# Patient Record
Sex: Female | Born: 1990
Health system: Southern US, Community
[De-identification: ages and names within clinical notes are randomized; demographics above are authoritative.]

---

## 2012-01-26 ENCOUNTER — Encounter (HOSPITAL_BASED_OUTPATIENT_CLINIC_OR_DEPARTMENT_OTHER): Payer: Self-pay | Admitting: *Deleted

## 2012-01-26 ENCOUNTER — Emergency Department (HOSPITAL_BASED_OUTPATIENT_CLINIC_OR_DEPARTMENT_OTHER)
Admission: EM | Admit: 2012-01-26 | Discharge: 2012-01-26 | Disposition: A | Discharge status: Home / Self Care | Payer: No Typology Code available for payment source | Attending: Emergency Medicine | Admitting: Emergency Medicine

## 2012-01-26 ENCOUNTER — Emergency Department (HOSPITAL_BASED_OUTPATIENT_CLINIC_OR_DEPARTMENT_OTHER): Payer: No Typology Code available for payment source

## 2012-01-26 DIAGNOSIS — R079 Chest pain, unspecified: Secondary | ICD-10-CM | POA: Insufficient documentation

## 2012-01-26 DIAGNOSIS — M25519 Pain in unspecified shoulder: Secondary | ICD-10-CM | POA: Insufficient documentation

## 2012-01-26 DIAGNOSIS — M542 Cervicalgia: Secondary | ICD-10-CM | POA: Insufficient documentation

## 2012-01-26 MED ORDER — HYDROCODONE-ACETAMINOPHEN 5-325 MG PO TABS: 2.0000 | ORAL_TABLET | ORAL | Status: AC | PRN
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
## 2012-01-26 NOTE — ED Provider Notes (Signed)
Medical screening examination/treatment/procedure(s) were performed by non-physician practitioner and as supervising physician I was immediately available for consultation/collaboration.   Zaeem Kandel, MD 01/26/12 2224 
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
## 2012-02-13 ENCOUNTER — Emergency Department (HOSPITAL_BASED_OUTPATIENT_CLINIC_OR_DEPARTMENT_OTHER)
Admission: EM | Admit: 2012-02-13 | Discharge: 2012-02-13 | Disposition: A | Discharge status: Home / Self Care | Payer: No Typology Code available for payment source | Attending: Emergency Medicine | Admitting: Emergency Medicine

## 2012-02-13 ENCOUNTER — Encounter (HOSPITAL_BASED_OUTPATIENT_CLINIC_OR_DEPARTMENT_OTHER): Payer: Self-pay | Admitting: *Deleted

## 2012-02-13 DIAGNOSIS — B86 Scabies: Secondary | ICD-10-CM | POA: Insufficient documentation

## 2012-02-13 MED ORDER — HYDROXYZINE HCL 25 MG PO TABS: 25.0000 mg | ORAL_TABLET | Freq: Four times a day (QID) | ORAL | Status: AC

## 2012-02-13 MED ORDER — PERMETHRIN 5 % EX CREA: TOPICAL_CREAM | CUTANEOUS | Status: AC

## 2012-02-13 NOTE — ED Provider Notes (Signed)
History     CSN: [id number]  Arrival date & time 02/13/12  1706   First MD Initiated Contact with Patient 02/13/12 1748      Chief Complaint  Patient presents with  . Rash    (Consider location/radiation/quality/duration/timing/severity/associated sxs/prior treatment) Patient is a 21 y.o. female presenting with rash. The history is provided by the patient. No language interpreter was used.  Rash  This is a new problem. The problem has been gradually worsening. The problem is associated with nothing. There has been no fever. The rash is present on the right hand and left hand. The pain is at a severity of 3/10. The pain is moderate. Associated symptoms include itching. She has tried nothing for the symptoms. The treatment provided no relief.  Pt complains of itching to both hands.  Pt complains of a rash.  History reviewed. No pertinent past medical history.  History reviewed. No pertinent past surgical history.  History reviewed. No pertinent family history.  History  Substance Use Topics  . Smoking status: Never Smoker   . Smokeless tobacco: Not on file  . Alcohol Use: No    OB History    Grav Para Term Preterm Abortions TAB SAB Ect Mult Living                  Review of Systems  Skin: Positive for itching and rash.  All other systems reviewed and are negative.    Allergies  Review of patient's allergies indicates no known allergies.  Home Medications  No current outpatient prescriptions on file.  BP 115/75  Pulse 86  Temp 98.6 F (37 C) (Oral)  Resp 16  Ht 5' (1.524 m)  Wt 129 lb (58.514 kg)  BMI 25.19 kg/m2  SpO2 100%  LMP 02/13/2012  Physical Exam  Nursing note and vitals reviewed. Constitutional: She is oriented to person, place, and time. She appears well-developed and well-nourished.  HENT:  Head: Normocephalic and atraumatic.  Cardiovascular: Normal rate.   Pulmonary/Chest: Effort normal.  Musculoskeletal: Normal range of motion.    Neurological: She is alert and oriented to person, place, and time. She has normal reflexes.  Skin: Rash noted.       Burrows between fingers,    Psychiatric: She has a normal mood and affect.    ED Course  Procedures (including critical care time)  Labs Reviewed - No data to display No results found.   1. Scabies       MDM  Atarax and elemite.        Lonia Skinner [place], [place] 02/13/12 1810  Lonia Skinner [place], [place] 02/13/12 [phone]

## 2012-02-13 NOTE — ED Provider Notes (Signed)
Medical screening examination/treatment/procedure(s) were performed by non-physician practitioner and as supervising physician I was immediately available for consultation/collaboration.   Madesyn Ast, MD 02/13/12 2337 

## 2012-02-13 NOTE — ED Notes (Signed)
Pt c/o rash to bil hands x 1 week
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
## 2014-01-08 ENCOUNTER — Inpatient Hospital Stay (HOSPITAL_COMMUNITY)
Admission: AD | Admit: 2014-01-08 | Discharge: 2014-01-08 | Disposition: A | Discharge status: Home / Self Care | Payer: No Typology Code available for payment source | Attending: Obstetrics and Gynecology | Admitting: Obstetrics and Gynecology

## 2015-02-01 ENCOUNTER — Encounter (HOSPITAL_COMMUNITY): Payer: Self-pay | Admitting: Emergency Medicine

## 2015-02-01 ENCOUNTER — Emergency Department (HOSPITAL_COMMUNITY)
Admission: EM | Admit: 2015-02-01 | Discharge: 2015-02-01 | Disposition: A | Discharge status: Home / Self Care | Payer: No Typology Code available for payment source | Attending: Emergency Medicine | Admitting: Emergency Medicine

## 2015-02-01 DIAGNOSIS — Y9389 Activity, other specified: Secondary | ICD-10-CM | POA: Insufficient documentation

## 2015-02-01 DIAGNOSIS — Y9289 Other specified places as the place of occurrence of the external cause: Secondary | ICD-10-CM | POA: Insufficient documentation

## 2015-02-01 DIAGNOSIS — S3992XA Unspecified injury of lower back, initial encounter: Secondary | ICD-10-CM | POA: Insufficient documentation

## 2015-02-01 DIAGNOSIS — Y998 Other external cause status: Secondary | ICD-10-CM | POA: Insufficient documentation

## 2015-02-01 DIAGNOSIS — S161XXA Strain of muscle, fascia and tendon at neck level, initial encounter: Secondary | ICD-10-CM

## 2015-02-01 MED ORDER — IBUPROFEN 600 MG PO TABS: 600.0000 mg | ORAL_TABLET | Freq: Four times a day (QID) | ORAL | Status: AC | PRN

## 2015-02-01 MED ORDER — CYCLOBENZAPRINE HCL 10 MG PO TABS
10.0000 mg | ORAL_TABLET | Freq: Two times a day (BID) | ORAL | Status: AC | PRN
Start: 2015-02-01 — End: ?
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
## 2015-02-01 NOTE — Discharge Instructions (Signed)
Cervical Strain and Sprain (Whiplash) °with Rehab °Cervical strain and sprain are injuries that commonly occur with "whiplash" injuries. Whiplash occurs when the neck is forcefully whipped backward or forward, such as during a motor vehicle accident or during contact sports. The muscles, ligaments, tendons, discs, and nerves of the neck are susceptible to injury when this occurs. °RISK FACTORS °Risk of having a whiplash injury increases if: °· Osteoarthritis of the spine. °· Situations that make head or neck accidents or trauma more likely. °· High-risk sports (football, rugby, wrestling, hockey, auto racing, gymnastics, diving, contact karate, or boxing). °· Poor strength and flexibility of the neck. °· Previous neck injury. °· Poor tackling technique. °· Improperly fitted or padded equipment. °SYMPTOMS  °· Pain or stiffness in the front or back of neck or both. °· Symptoms may present immediately or up to 24 hours after injury. °· Dizziness, headache, nausea, and vomiting. °· Muscle spasm with soreness and stiffness in the neck. °· Tenderness and swelling at the injury site. °PREVENTION °· Learn and use proper technique (avoid tackling with the head, spearing, and head-butting; use proper falling techniques to avoid landing on the head). °· Warm up and stretch properly before activity. °· Maintain physical fitness: °· Strength, flexibility, and endurance. °· Cardiovascular fitness. °· Wear properly fitted and padded protective equipment, such as padded soft collars, for participation in contact sports. °PROGNOSIS  °Recovery from cervical strain and sprain injuries is dependent on the extent of the injury. These injuries are usually curable in 1 week to 3 months with appropriate treatment.  °RELATED COMPLICATIONS  °· Temporary numbness and weakness may occur if the nerve roots are damaged, and this may persist until the nerve has completely healed. °· Chronic pain due to frequent recurrence of  symptoms. °· Prolonged healing, especially if activity is resumed too soon (before complete recovery). °TREATMENT  °Treatment initially involves the use of ice and medication to help reduce pain and inflammation. It is also important to perform strengthening and stretching exercises and modify activities that worsen symptoms so the injury does not get worse. These exercises may be performed at home or with a therapist. For patients who experience severe symptoms, a soft, padded collar may be recommended to be worn around the neck.  °Improving your posture may help reduce symptoms. Posture improvement includes pulling your chin and abdomen in while sitting or standing. If you are sitting, sit in a firm chair with your buttocks against the back of the chair. While sleeping, try replacing your pillow with a small towel rolled to 2 inches in diameter, or use a cervical pillow or soft cervical collar. Poor sleeping positions delay healing.  °For patients with nerve root damage, which causes numbness or weakness, the use of a cervical traction apparatus may be recommended. Surgery is rarely necessary for these injuries. However, cervical strain and sprains that are present at birth (congenital) may require surgery. °MEDICATION  °· If pain medication is necessary, nonsteroidal anti-inflammatory medications, such as aspirin and ibuprofen, or other minor pain relievers, such as acetaminophen, are often recommended. °· Do not take pain medication for 7 days before surgery. °· Prescription pain relievers may be given if deemed necessary by your caregiver. Use only as directed and only as much as you need. °HEAT AND COLD:  °· Cold treatment (icing) relieves pain and reduces inflammation. Cold treatment should be applied for 10 to 15 minutes every 2 to 3 hours for inflammation and pain and immediately after any activity that aggravates   your symptoms. Use ice packs or an ice massage. °· Heat treatment may be used prior to  performing the stretching and strengthening activities prescribed by your caregiver, physical therapist, or athletic trainer. Use a heat pack or a warm soak. °SEEK MEDICAL CARE IF:  °· Symptoms get worse or do not improve in 2 weeks despite treatment. °· New, unexplained symptoms develop (drugs used in treatment may produce side effects). °EXERCISES °RANGE OF MOTION (ROM) AND STRETCHING EXERCISES - Cervical Strain and Sprain °These exercises may help you when beginning to rehabilitate your injury. In order to successfully resolve your symptoms, you must improve your posture. These exercises are designed to help reduce the forward-head and rounded-shoulder posture which contributes to this condition. Your symptoms may resolve with or without further involvement from your physician, physical therapist or athletic trainer. While completing these exercises, remember:  °· Restoring tissue flexibility helps normal motion to return to the joints. This allows healthier, less painful movement and activity. °· An effective stretch should be held for at least 20 seconds, although you may need to begin with shorter hold times for comfort. °· A stretch should never be painful. You should only feel a gentle lengthening or release in the stretched tissue. °STRETCH- Axial Extensors °· Lie on your back on the floor. You may bend your knees for comfort. Place a rolled-up hand towel or dish towel, about 2 inches in diameter, under the part of your head that makes contact with the floor. °· Gently tuck your chin, as if trying to make a "double chin," until you feel a gentle stretch at the base of your head. °· Hold __________ seconds. °Repeat __________ times. Complete this exercise __________ times per day.  °STRETCH - Axial Extension  °· Stand or sit on a firm surface. Assume a good posture: chest up, shoulders drawn back, abdominal muscles slightly tense, knees unlocked (if standing) and feet hip width apart. °· Slowly retract your  chin so your head slides back and your chin slightly lowers. Continue to look straight ahead. °· You should feel a gentle stretch in the back of your head. Be certain not to feel an aggressive stretch since this can cause headaches later. °· Hold for __________ seconds. °Repeat __________ times. Complete this exercise __________ times per day. °STRETCH - Cervical Side Bend  °· Stand or sit on a firm surface. Assume a good posture: chest up, shoulders drawn back, abdominal muscles slightly tense, knees unlocked (if standing) and feet hip width apart. °· Without letting your nose or shoulders move, slowly tip your right / left ear to your shoulder until your feel a gentle stretch in the muscles on the opposite side of your neck. °· Hold __________ seconds. °Repeat __________ times. Complete this exercise __________ times per day. °STRETCH - Cervical Rotators  °· Stand or sit on a firm surface. Assume a good posture: chest up, shoulders drawn back, abdominal muscles slightly tense, knees unlocked (if standing) and feet hip width apart. °· Keeping your eyes level with the ground, slowly turn your head until you feel a gentle stretch along the back and opposite side of your neck. °· Hold __________ seconds. °Repeat __________ times. Complete this exercise __________ times per day. °RANGE OF MOTION - Neck Circles  °· Stand or sit on a firm surface. Assume a good posture: chest up, shoulders drawn back, abdominal muscles slightly tense, knees unlocked (if standing) and feet hip width apart. °· Gently roll your head down and around from the   back of one shoulder to the back of the other. The motion should never be forced or painful. °· Repeat the motion 10-20 times, or until you feel the neck muscles relax and loosen. °Repeat __________ times. Complete the exercise __________ times per day. °STRENGTHENING EXERCISES - Cervical Strain and Sprain °These exercises may help you when beginning to rehabilitate your injury. They may  resolve your symptoms with or without further involvement from your physician, physical therapist, or athletic trainer. While completing these exercises, remember:  °· Muscles can gain both the endurance and the strength needed for everyday activities through controlled exercises. °· Complete these exercises as instructed by your physician, physical therapist, or athletic trainer. Progress the resistance and repetitions only as guided. °· You may experience muscle soreness or fatigue, but the pain or discomfort you are trying to eliminate should never worsen during these exercises. If this pain does worsen, stop and make certain you are following the directions exactly. If the pain is still present after adjustments, discontinue the exercise until you can discuss the trouble with your clinician. °STRENGTH - Cervical Flexors, Isometric °· Face a wall, standing about 6 inches away. Place a small pillow, a ball about 6-8 inches in diameter, or a folded towel between your forehead and the wall. °· Slightly tuck your chin and gently push your forehead into the soft object. Push only with mild to moderate intensity, building up tension gradually. Keep your jaw and forehead relaxed. °· Hold 10 to 20 seconds. Keep your breathing relaxed. °· Release the tension slowly. Relax your neck muscles completely before you start the next repetition. °Repeat __________ times. Complete this exercise __________ times per day. °STRENGTH- Cervical Lateral Flexors, Isometric  °· Stand about 6 inches away from a wall. Place a small pillow, a ball about 6-8 inches in diameter, or a folded towel between the side of your head and the wall. °· Slightly tuck your chin and gently tilt your head into the soft object. Push only with mild to moderate intensity, building up tension gradually. Keep your jaw and forehead relaxed. °· Hold 10 to 20 seconds. Keep your breathing relaxed. °· Release the tension slowly. Relax your neck muscles completely  before you start the next repetition. °Repeat __________ times. Complete this exercise __________ times per day. °STRENGTH - Cervical Extensors, Isometric  °· Stand about 6 inches away from a wall. Place a small pillow, a ball about 6-8 inches in diameter, or a folded towel between the back of your head and the wall. °· Slightly tuck your chin and gently tilt your head back into the soft object. Push only with mild to moderate intensity, building up tension gradually. Keep your jaw and forehead relaxed. °· Hold 10 to 20 seconds. Keep your breathing relaxed. °· Release the tension slowly. Relax your neck muscles completely before you start the next repetition. °Repeat __________ times. Complete this exercise __________ times per day. °POSTURE AND BODY MECHANICS CONSIDERATIONS - Cervical Strain and Sprain °Keeping correct posture when sitting, standing or completing your activities will reduce the stress put on different body tissues, allowing injured tissues a chance to heal and limiting painful experiences. The following are general guidelines for improved posture. Your physician or physical therapist will provide you with any instructions specific to your needs. While reading these guidelines, remember: °· The exercises prescribed by your provider will help you have the flexibility and strength to maintain correct postures. °· The correct posture provides the optimal environment for your joints to   work. All of your joints have less wear and tear when properly supported by a spine with good posture. This means you will experience a healthier, less painful body. °· Correct posture must be practiced with all of your activities, especially prolonged sitting and standing. Correct posture is as important when doing repetitive low-stress activities (typing) as it is when doing a single heavy-load activity (lifting). °PROLONGED STANDING WHILE SLIGHTLY LEANING FORWARD °When completing a task that requires you to lean  forward while standing in one place for a long time, place either foot up on a stationary 2- to 4-inch high object to help maintain the best posture. When both feet are on the ground, the low back tends to lose its slight inward curve. If this curve flattens (or becomes too large), then the back and your other joints will experience too much stress, fatigue more quickly, and can cause pain.  °RESTING POSITIONS °Consider which positions are most painful for you when choosing a resting position. If you have pain with flexion-based activities (sitting, bending, stooping, squatting), choose a position that allows you to rest in a less flexed posture. You would want to avoid curling into a fetal position on your side. If your pain worsens with extension-based activities (prolonged standing, working overhead), avoid resting in an extended position such as sleeping on your stomach. Most people will find more comfort when they rest with their spine in a more neutral position, neither too rounded nor too arched. Lying on a non-sagging bed on your side with a pillow between your knees, or on your back with a pillow under your knees will often provide some relief. Keep in mind, being in any one position for a prolonged period of time, no matter how correct your posture, can still lead to stiffness. °WALKING °Walk with an upright posture. Your ears, shoulders, and hips should all line up. °OFFICE WORK °When working at a desk, create an environment that supports good, upright posture. Without extra support, muscles fatigue and lead to excessive strain on joints and other tissues. °CHAIR: °· A chair should be able to slide under your desk when your back makes contact with the back of the chair. This allows you to work closely. °· The chair's height should allow your eyes to be level with the upper part of your monitor and your hands to be slightly lower than your elbows. °· Body position: °¨ Your feet should make contact with the  floor. If this is not possible, use a foot rest. °¨ Keep your ears over your shoulders. This will reduce stress on your neck and low back. °Document Released: 05/23/2005 Document Revised: 10/07/2013 Document Reviewed: 09/04/2008 °[employer]® Patient Information ©2015 [employer]. This information is not intended to replace advice given to you by your health care provider. Make sure you discuss any questions you have with your health care provider. °Motor Vehicle Collision °It is common to have multiple bruises and sore muscles after a motor vehicle collision (MVC). These tend to feel worse for the first 24 hours. You may have the most stiffness and soreness over the first several hours. You may also feel worse when you wake up the first morning after your collision. After this point, you will usually begin to improve with each day. The speed of improvement often depends on the severity of the collision, the number of injuries, and the location and nature of these injuries. °HOME CARE INSTRUCTIONS °· Put ice on the injured area. °¨ Put ice in a   plastic bag. °¨ Place a towel between your skin and the bag. °¨ Leave the ice on for 15-20 minutes, 3-4 times a day, or as directed by your health care provider. °· Drink enough fluids to keep your urine clear or pale yellow. Do not drink alcohol. °· Take a warm shower or bath once or twice a day. This will increase blood flow to sore muscles. °· You may return to activities as directed by your caregiver. Be careful when lifting, as this may aggravate neck or back pain. °· Only take over-the-counter or prescription medicines for pain, discomfort, or fever as directed by your caregiver. Do not use aspirin. This may increase bruising and bleeding. °SEEK IMMEDIATE MEDICAL CARE IF: °· You have numbness, tingling, or weakness in the arms or legs. °· You develop severe headaches not relieved with medicine. °· You have severe neck pain, especially tenderness in the middle of the back  of your neck. °· You have changes in bowel or bladder control. °· There is increasing pain in any area of the body. °· You have shortness of breath, light-headedness, dizziness, or fainting. °· You have chest pain. °· You feel sick to your stomach (nauseous), throw up (vomit), or sweat. °· You have increasing abdominal discomfort. °· There is blood in your urine, stool, or vomit. °· You have pain in your shoulder (shoulder strap areas). °· You feel your symptoms are getting worse. °MAKE SURE YOU: °· Understand these instructions. °· Will watch your condition. °· Will get help right away if you are not doing well or get worse. °Document Released: 05/23/2005 Document Revised: 10/07/2013 Document Reviewed: 10/20/2010 °[employer]® Patient Information ©2015 [employer]. This information is not intended to replace advice given to you by your health care provider. Make sure you discuss any questions you have with your health care provider. ° °
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
## 2015-02-10 ENCOUNTER — Emergency Department (HOSPITAL_BASED_OUTPATIENT_CLINIC_OR_DEPARTMENT_OTHER)
Admission: EM | Admit: 2015-02-10 | Discharge: 2015-02-10 | Disposition: A | Discharge status: Home / Self Care | Payer: No Typology Code available for payment source | Attending: Emergency Medicine | Admitting: Emergency Medicine

## 2015-02-10 ENCOUNTER — Encounter (HOSPITAL_BASED_OUTPATIENT_CLINIC_OR_DEPARTMENT_OTHER): Payer: Self-pay | Admitting: Emergency Medicine

## 2015-02-10 DIAGNOSIS — M79602 Pain in left arm: Secondary | ICD-10-CM | POA: Insufficient documentation

## 2015-02-10 DIAGNOSIS — M546 Pain in thoracic spine: Secondary | ICD-10-CM | POA: Insufficient documentation

## 2015-02-10 DIAGNOSIS — Z87828 Personal history of other (healed) physical injury and trauma: Secondary | ICD-10-CM | POA: Insufficient documentation
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
## 2015-02-10 NOTE — Discharge Instructions (Signed)
Back Pain, Adult Low back pain is very common. About 1 in 5 people have back pain.The cause of low back pain is rarely dangerous. The pain often gets better over time.About half of people with a sudden onset of back pain feel better in just 2 weeks. About 8 in 10 people feel better by 6 weeks.  CAUSES Some common causes of back pain include:  Strain of the muscles or ligaments supporting the spine.  Wear and tear (degeneration) of the spinal discs.  Arthritis.  Direct injury to the back. DIAGNOSIS Most of the time, the direct cause of low back pain is not known.However, back pain can be treated effectively even when the exact cause of the pain is unknown.Answering your caregiver's questions about your overall health and symptoms is one of the most accurate ways to make sure the cause of your pain is not dangerous. If your caregiver needs more information, he or she may order lab work or imaging tests (X-rays or MRIs).However, even if imaging tests show changes in your back, this usually does not require surgery. HOME CARE INSTRUCTIONS For many people, back pain returns.Since low back pain is rarely dangerous, it is often a condition that people can learn to manageon their own.   Remain active. It is stressful on the back to sit or stand in one place. Do not sit, drive, or stand in one place for more than 30 minutes at a time. Take short walks on level surfaces as soon as pain allows.Try to increase the length of time you walk each day.  Do not stay in bed.Resting more than 1 or 2 days can delay your recovery.  Do not avoid exercise or work.Your body is made to move.It is not dangerous to be active, even though your back may hurt.Your back will likely heal faster if you return to being active before your pain is gone.  Pay attention to your body when you bend and lift. Many people have less discomfortwhen lifting if they bend their knees, keep the load close to their bodies,and  avoid twisting. Often, the most comfortable positions are those that put less stress on your recovering back.  Find a comfortable position to sleep. Use a firm mattress and lie on your side with your knees slightly bent. If you lie on your back, put a pillow under your knees.  Only take over-the-counter or prescription medicines as directed by your caregiver. Over-the-counter medicines to reduce pain and inflammation are often the most helpful.Your caregiver may prescribe muscle relaxant drugs.These medicines help dull your pain so you can more quickly return to your normal activities and healthy exercise.  Put ice on the injured area.  Put ice in a plastic bag.  Place a towel between your skin and the bag.  Leave the ice on for 15-20 minutes, 03-04 times a day for the first 2 to 3 days. After that, ice and heat may be alternated to reduce pain and spasms.  Ask your caregiver about trying back exercises and gentle massage. This may be of some benefit.  Avoid feeling anxious or stressed.Stress increases muscle tension and can worsen back pain.It is important to recognize when you are anxious or stressed and learn ways to manage it.Exercise is a great option. SEEK MEDICAL CARE IF:  You have pain that is not relieved with rest or medicine.  You have pain that does not improve in 1 week.  You have new symptoms.  You are generally not feeling well. SEEK   IMMEDIATE MEDICAL CARE IF:   You have pain that radiates from your back into your legs.  You develop new bowel or bladder control problems.  You have unusual weakness or numbness in your arms or legs.  You develop nausea or vomiting.  You develop abdominal pain.  You feel faint. Document Released: 05/23/2005 Document Revised: 11/22/2011 Document Reviewed: 09/24/2013 Exit[employer] Patient Information 2015 [employer]. This information is not intended to replace advice given to you by your health care provider. Make sure you  discuss any questions you have with your health care provider.  

## 2015-04-05 ENCOUNTER — Emergency Department (HOSPITAL_COMMUNITY)
Admission: EM | Admit: 2015-04-05 | Discharge: 2015-04-05 | Disposition: A | Discharge status: Home / Self Care | Payer: No Typology Code available for payment source | Attending: Emergency Medicine | Admitting: Emergency Medicine

## 2015-04-05 ENCOUNTER — Encounter (HOSPITAL_COMMUNITY): Payer: Self-pay | Admitting: *Deleted

## 2015-04-05 DIAGNOSIS — Y998 Other external cause status: Secondary | ICD-10-CM | POA: Insufficient documentation

## 2015-04-05 DIAGNOSIS — X102XXA Contact with fats and cooking oils, initial encounter: Secondary | ICD-10-CM | POA: Insufficient documentation

## 2015-04-05 DIAGNOSIS — Y9389 Activity, other specified: Secondary | ICD-10-CM | POA: Insufficient documentation

## 2015-04-05 DIAGNOSIS — T22231A Burn of second degree of right upper arm, initial encounter: Secondary | ICD-10-CM | POA: Insufficient documentation

## 2015-04-05 DIAGNOSIS — Y9289 Other specified places as the place of occurrence of the external cause: Secondary | ICD-10-CM | POA: Insufficient documentation

## 2015-04-05 DIAGNOSIS — Z23 Encounter for immunization: Secondary | ICD-10-CM | POA: Insufficient documentation

## 2015-04-05 DIAGNOSIS — T2020XA Burn of second degree of head, face, and neck, unspecified site, initial encounter: Secondary | ICD-10-CM | POA: Insufficient documentation

## 2015-04-05 DIAGNOSIS — T2123XA Burn of second degree of upper back, initial encounter: Secondary | ICD-10-CM | POA: Insufficient documentation

## 2015-04-05 DIAGNOSIS — T22232A Burn of second degree of left upper arm, initial encounter: Secondary | ICD-10-CM | POA: Insufficient documentation

## 2015-04-05 DIAGNOSIS — T300 Burn of unspecified body region, unspecified degree: Secondary | ICD-10-CM

## 2015-04-05 MED ORDER — TETANUS-DIPHTH-ACELL PERTUSSIS 5-2.5-18.5 LF-MCG/0.5 IM SUSP
0.5000 mL | Freq: Once | INTRAMUSCULAR | Status: AC
  Administered 2015-04-05: 0.5 mL via INTRAMUSCULAR
  Filled 2015-04-05: qty 0.5

## 2015-04-05 MED ORDER — DOUBLE ANTIBIOTIC 500-10000 UNIT/GM EX OINT
TOPICAL_OINTMENT | Freq: Two times a day (BID) | CUTANEOUS | Status: DC
  Administered 2015-04-05: 17:00:00 via TOPICAL
  Filled 2015-04-05: qty 28.35

## 2015-04-05 MED ORDER — BACITRACIN ZINC 500 UNIT/GM EX OINT: 1.0000 "application " | TOPICAL_OINTMENT | Freq: Two times a day (BID) | CUTANEOUS | Status: AC
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
## 2015-04-05 NOTE — Discharge Instructions (Signed)
Burn Care °Your skin is a natural barrier to infection. It is the largest organ of your body. Burns damage this natural protection. To help prevent infection, it is very important to follow your caregiver's instructions in the care of your burn. °Burns are classified as: °· First degree. There is only redness of the skin (erythema). No scarring is expected. °· Second degree. There is blistering of the skin. Scarring may occur with deeper burns. °· Third degree. All layers of the skin are injured, and scarring is expected. °HOME CARE INSTRUCTIONS  °· Wash your hands well before changing your bandage. °· Change your bandage as often as directed by your caregiver. °¨ Remove the old bandage. If the bandage sticks, you may soak it off with cool, clean water. °¨ Cleanse the burn thoroughly but gently with mild soap and water. °¨ Pat the area dry with a clean, dry cloth. °¨ Apply a thin layer of antibacterial cream to the burn. °¨ Apply a clean bandage as instructed by your caregiver. °¨ Keep the bandage as clean and dry as possible. °· Elevate the affected area for the first 24 hours, then as instructed by your caregiver. °· Only take over-the-counter or prescription medicines for pain, discomfort, or fever as directed by your caregiver. °SEEK IMMEDIATE MEDICAL CARE IF:  °· You develop excessive pain. °· You develop redness, tenderness, swelling, or red streaks near the burn. °· The burned area develops yellowish-white fluid (pus) or a bad smell. °· You have a fever. °MAKE SURE YOU:  °· Understand these instructions. °· Will watch your condition. °· Will get help right away if you are not doing well or get worse. °  °This information is not intended to replace advice given to you by your health care provider. Make sure you discuss any questions you have with your health care provider. °  °Document Released: 05/23/2005 Document Revised: 08/15/2011 Document Reviewed: 10/13/2010 °Elsevier Interactive Patient Education ©2016  [employer] ° °
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
## 2023-04-17 ENCOUNTER — Encounter (INDEPENDENT_AMBULATORY_CARE_PROVIDER_SITE_OTHER): Payer: Self-pay

## 2023-04-17 ENCOUNTER — Ambulatory Visit (INDEPENDENT_AMBULATORY_CARE_PROVIDER_SITE_OTHER): Payer: BC Managed Care – PPO | Admitting: Otolaryngology

## 2023-04-17 ENCOUNTER — Encounter: Payer: Self-pay | Admitting: Podiatry

## 2023-04-17 ENCOUNTER — Ambulatory Visit (INDEPENDENT_AMBULATORY_CARE_PROVIDER_SITE_OTHER): Payer: BC Managed Care – PPO | Admitting: Podiatry

## 2023-04-17 VITALS — Ht 60.0 in | Wt 130.0 lb

## 2023-04-17 DIAGNOSIS — H900 Conductive hearing loss, bilateral: Secondary | ICD-10-CM | POA: Diagnosis not present

## 2023-04-17 DIAGNOSIS — B353 Tinea pedis: Secondary | ICD-10-CM

## 2023-04-17 DIAGNOSIS — H6123 Impacted cerumen, bilateral: Secondary | ICD-10-CM | POA: Diagnosis not present

## 2023-04-17 DIAGNOSIS — G629 Polyneuropathy, unspecified: Secondary | ICD-10-CM

## 2023-04-17 MED ORDER — KETOCONAZOLE 2 % EX CREA: 1.0000 | TOPICAL_CREAM | Freq: Every day | CUTANEOUS | 0 refills | Status: AC

## 2023-04-17 NOTE — Progress Notes (Signed)
  Subjective:  Patient ID: Nicole Martinez, female    DOB: 09/04/1990,  MRN: [id number]  Chief Complaint  Patient presents with   Tinea Pedis    Interdigital maceration most notably 3rd and 4th webspaces for several months   Numbness    Right hallux intermittent numbess sensation distal plantar aspect for about 3 weeks. Patient this can be due to switching steel toed boots at work    32 y.o. female presents with concern for rash and itching in between the toes on bilateral foot as well as some dry flaking skin on the plantar aspect of the foot bilaterally.  Also has some numbness in the right great toe.  Does have to wear steel toe boots at work and says they are tight around the forefoot.  History reviewed. No pertinent past medical history.  No Known Allergies  ROS: Negative except as per HPI above  Objective:  General: AAO x3, NAD  Dermatological: In the webspace especially third and fourth webspace bilateral foot with red discoloration and itching present.  Dry flaking skin plantar aspect of the foot bilaterally  Vascular:  Dorsalis Pedis artery and Posterior Tibial artery pedal pulses are 2/4 bilateral.  Capillary fill time < 3 sec to all digits.   Neruologic: Grossly intact via light touch bilateral.  Slightly decreased sensation to the distal aspect of the right and left hallux  Musculoskeletal: Mild arthritic changes noted of the first MPJ bilaterally.  Gait: Unassisted, Nonantalgic.   No images are attached to the encounter.  Radiographs:  Deferred Assessment:   1. Tinea pedis of both feet   2. Neuropathy      Plan:  Patient was evaluated and treated and all questions answered.  #tinea pedis bilateral Discussed the etiology and treatment options for tinea pedis.  Discussed topical and oral treatment.  Recommended topical treatment with 2% ketoconazole cream.  This was sent to the patient's pharmacy.  Also discussed appropriate foot hygiene, use of antifungal  spray such as Tinactin in shoes, as well as cleaning her foot surfaces such as showers and bathroom floors with bleach.  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          Corinna Gab, DPM [facility] / [facility]

## 2023-04-18 DIAGNOSIS — H6123 Impacted cerumen, bilateral: Secondary | ICD-10-CM | POA: Insufficient documentation

## 2023-04-18 DIAGNOSIS — H900 Conductive hearing loss, bilateral: Secondary | ICD-10-CM | POA: Insufficient documentation

## 2023-04-18 NOTE — Progress Notes (Signed)
Patient ID: Zaniyla Moorhouse, female   DOB: Dec 13, 1990, 32 y.o.   MRN: [id number]  CC: Hearing difficulty  HPI:  Jahla Carella is a 32 y.o. female who presents today complaining of hearing difficulty over the past 3 weeks.  Her hearing problem is more pronounced on the right side.  She has a constant muffled sensation.  She was recently noted to have bilateral cerumen impaction.  Attempts to remove the cerumen at an urgent care center was unsuccessful.  The patient denies any significant otalgia, otorrhea, or vertigo.  She has no previous otologic surgery.  She also denies any recent otitis media or otitis externa.  History reviewed. No pertinent past medical history.  History reviewed. No pertinent surgical history.  History reviewed. No pertinent family history.  Social History:  reports that she has never smoked. She has never used smokeless tobacco. She reports that she does not drink alcohol and does not use drugs.  Allergies: No Known Allergies  Prior to Admission medications   Medication Sig Start Date End Date Taking? Authorizing Provider  ketoconazole (NIZORAL) 2 % cream Apply 1 Application topically daily. 04/17/23  Yes Standiford, Jenelle Mages, DPM  bacitracin ointment Apply 1 application topically 2 (two) times daily. Patient not taking: Reported on 04/17/2023 04/05/15   Cheri Fowler, PA-C  cyclobenzaprine (FLEXERIL) 10 MG tablet Take 1 tablet (10 mg total) by mouth 2 (two) times daily as needed for muscle spasms. Patient not taking: Reported on 04/17/2023 02/01/15   Roxy Horseman, PA-C  ibuprofen (ADVIL,MOTRIN) 600 MG tablet Take 1 tablet (600 mg total) by mouth every 6 (six) hours as needed. Patient not taking: Reported on 04/17/2023 02/01/15   Roxy Horseman, PA-C   Height 5' (1.524 m), weight 130 lb (59 kg). Exam: General: Communicates without difficulty, well nourished, no acute distress. Head: Normocephalic, no evidence injury, no tenderness, facial buttresses intact  without stepoff. Face/sinus: No tenderness to palpation and percussion. Facial movement is normal and symmetric. Eyes: PERRL, EOMI. No scleral icterus, conjunctivae clear. Neuro: CN II exam reveals vision grossly intact.  No nystagmus at any point of gaze. Ears: Auricles well formed without lesions. EAC: Bilateral cerumen impaction.  Under the operating microscope, the cerumen is carefully removed with a combination of cerumen currette, alligator forceps, and suction catheters.  After the cerumen is removed, the TMs are noted to be normal.   Nose: External evaluation reveals normal support and skin without lesions.  Dorsum is intact.  Anterior rhinoscopy reveals congested mucosa over anterior aspect of inferior turbinates and intact septum.  No purulence noted. Oral:  Oral cavity and oropharynx are intact, symmetric, without erythema or edema.  Mucosa is moist without lesions. Neck: Full range of motion without pain.  There is no significant lymphadenopathy.  No masses palpable.  Thyroid bed within normal limits to palpation.  Parotid glands and submandibular glands equal bilaterally without mass.  Trachea is midline. Neuro:  CN 2-12 grossly intact.    Assessment: 1.  Bilateral cerumen impaction.  After the cerumen disimpaction procedure, both tympanic membranes and middle ear spaces are noted to be normal. 2.  Transient conductive hearing loss, secondary to the cerumen impaction.  The patient reports significant improvement in her hearing after the disimpaction procedure.  Plan: 1.  Otomicroscopy with bilateral cerumen disimpaction. 2.  The physical exam findings are reviewed with the patient. 3.  The patient is instructed not to use Q-tips to clean her ear canals. 4.  The patient will return for reevaluation in 6  months.  Mikylah Ackroyd W Cyndee Giammarco 04/18/2023, 12:55 PM

## 2023-06-05 ENCOUNTER — Ambulatory Visit: Payer: Self-pay | Admitting: Podiatry

## 2023-10-12 ENCOUNTER — Telehealth: Payer: Self-pay | Admitting: *Deleted

## 2023-10-12 NOTE — Telephone Encounter (Signed)
 LVM to confirm appt for 5/12 - included address and time - [facility] 10/12/23

## 2023-10-16 ENCOUNTER — Ambulatory Visit (INDEPENDENT_AMBULATORY_CARE_PROVIDER_SITE_OTHER): Payer: BC Managed Care – PPO | Admitting: Otolaryngology
